# Patient Record
Sex: Male | Born: 1965 | Race: Asian | Hispanic: No | Marital: Married | Smoking: Never smoker
Health system: Southern US, Community
[De-identification: ages and names within clinical notes are randomized; demographics above are authoritative.]

---

## 2011-03-24 ENCOUNTER — Emergency Department (HOSPITAL_COMMUNITY)
Admission: EM | Admit: 2011-03-24 | Discharge: 2011-03-25 | Discharge status: Against Medical Advice | Payer: Worker's Compensation | Attending: Emergency Medicine | Admitting: Emergency Medicine

## 2011-03-24 DIAGNOSIS — Y93G3 Activity, cooking and baking: Secondary | ICD-10-CM | POA: Insufficient documentation

## 2011-03-24 DIAGNOSIS — T23039A Burn of unspecified degree of unspecified multiple fingers (nail), not including thumb, initial encounter: Secondary | ICD-10-CM | POA: Insufficient documentation

## 2011-03-24 DIAGNOSIS — X12XXXA Contact with other hot fluids, initial encounter: Secondary | ICD-10-CM | POA: Insufficient documentation

## 2011-03-24 DIAGNOSIS — X131XXA Other contact with steam and other hot vapors, initial encounter: Secondary | ICD-10-CM | POA: Insufficient documentation

## 2015-09-23 ENCOUNTER — Emergency Department
Admission: EM | Admit: 2015-09-23 | Discharge: 2015-09-23 | Disposition: A | Discharge status: Home / Self Care | Payer: BLUE CROSS/BLUE SHIELD | Source: Home / Self Care | Attending: Family Medicine | Admitting: Family Medicine

## 2015-09-23 ENCOUNTER — Emergency Department (INDEPENDENT_AMBULATORY_CARE_PROVIDER_SITE_OTHER): Payer: BLUE CROSS/BLUE SHIELD

## 2015-09-23 ENCOUNTER — Encounter: Payer: Self-pay | Admitting: *Deleted

## 2015-09-23 DIAGNOSIS — S29012A Strain of muscle and tendon of back wall of thorax, initial encounter: Secondary | ICD-10-CM | POA: Diagnosis not present

## 2015-09-23 DIAGNOSIS — M25511 Pain in right shoulder: Secondary | ICD-10-CM | POA: Diagnosis not present

## 2015-09-23 DIAGNOSIS — M25521 Pain in right elbow: Secondary | ICD-10-CM

## 2015-09-23 MED ORDER — MELOXICAM 15 MG PO TABS: 15.0000 mg | ORAL_TABLET | Freq: Every day | ORAL | Status: DC

## 2015-09-23 MED ORDER — CYCLOBENZAPRINE HCL 10 MG PO TABS: ORAL_TABLET | ORAL | Status: DC

## 2015-09-23 NOTE — Discharge Instructions (Signed)
Apply ice pack for 20 to 30 minutes, 3 to 4 times daily  Continue until pain decreases.  Begin exercises as tolerated.

## 2015-09-23 NOTE — ED Notes (Signed)
Pt c/o RT elbow pain radiating to his RT shoulder x 2-3 wks. He has applied ice, completed Prednisone 09/21/15, and is taking IBF as needed. He reports W/C denied his claim. He can not get an appt with ortho appt til 10/02/15.

## 2015-09-23 NOTE — ED Provider Notes (Signed)
CSN: [id number]     Arrival date & time 09/23/15  1829 History   First MD Initiated Contact with Patient 09/23/15 1935     Chief Complaint  Patient presents with  . Shoulder Pain      HPI Comments: Patient complains of intermittent pain in his right elbow and right shoulder blade area for about 3 weeks.  He recalls no injury, although his job involves repetitive motion with both arms.  He complains of pain in his right elbow area that radiates up towards his right shoulder, and the pain has increased during the past week.  He states that he finished taking a prednisone pack for right lateral epicondylitis about a week ago  The history is provided by the patient.    History reviewed. No pertinent past medical history. History reviewed. No pertinent past surgical history. History reviewed. No pertinent family history. Social History  Substance Use Topics  . Smoking status: Never Smoker   . Smokeless tobacco: None  . Alcohol Use: No    Review of Systems  Constitutional: Negative for fever, chills, activity change and fatigue.  HENT: Negative.   Eyes: Negative.   Respiratory: Negative.   Cardiovascular: Negative.   Gastrointestinal: Negative.   Genitourinary: Negative.   Musculoskeletal: Positive for back pain. Negative for joint swelling.       Pain in right elbow and right upper back  Skin: Negative.   Neurological: Negative.     Allergies  Review of patient's allergies indicates no known allergies.  Home Medications   Prior to Admission medications   Medication Sig Start Date End Date Taking? Authorizing Provider  ibuprofen (ADVIL,MOTRIN) 200 MG tablet Take 200 mg by mouth every 6 (six) hours as needed.   Yes Historical Provider, MD  cyclobenzaprine (FLEXERIL) 10 MG tablet Take one tab by mouth at bedtime for muscle spasm 09/23/15   Lattie HawStephen A Beese, MD  meloxicam (MOBIC) 15 MG tablet Take 1 tablet (15 mg total) by mouth daily. Take with food each morning 09/23/15   Lattie HawStephen A  Beese, MD   Meds Ordered and Administered this Visit  Medications - No data to display  BP 144/88 mmHg  Pulse 73  Temp(Src) 97.6 F (36.4 C) (Oral)  Resp 16  Ht 5\' 6"  (1.676 m)  Wt 187 lb (84.823 kg)  BMI 30.20 kg/m2  SpO2 99% No data found.   Physical Exam  Constitutional: He is oriented to person, place, and time. He appears well-developed and well-nourished. No distress.  HENT:  Head: Normocephalic.  Mouth/Throat: Oropharynx is clear and moist.  Eyes: Pupils are equal, round, and reactive to light.  Neck: Normal range of motion.  Cardiovascular: Normal heart sounds.   Pulmonary/Chest: Breath sounds normal.    There is tenderness to palpation over the right rhomboid area  as noted on diagram.    Musculoskeletal:       Right elbow: He exhibits normal range of motion and no swelling. Tenderness found.  Right shoulder has full range of motion without tenderness to palpation.  Negative Apley's test.  Negative Empty can test. Right  Elbow has full range of motion.  There is vague tenderness to palpation over the radial head, but no tenderness over the epicondyles or olecranon.  Neurological: He is alert and oriented to person, place, and time.  Skin: Skin is warm and dry. No rash noted.  Nursing note and vitals reviewed.   ED Course  Procedures  None   Imaging Review Dg Shoulder Right  09/23/2015  CLINICAL DATA:  Right shoulder pain for 3 weeks potentially due to repetitive work motion. EXAM: RIGHT SHOULDER - 2+ VIEW COMPARISON:  None. FINDINGS: There is no evidence of fracture or dislocation. There is no evidence of arthropathy or other focal bone abnormality. Soft tissues are unremarkable. IMPRESSION: Normal right shoulder. Electronically Signed   By: Lupita Raider, M.D.   On: 09/23/2015 19:14      MDM   1. Rhomboid muscle strain, initial encounter   2. Right elbow pain     Begin Mobic  daily.  Flexeril  HS Apply ice pack for 20 to 30 minutes, 3 to 4  times daily  Continue until pain decreases.  Begin exercises as tolerated. Followup with Dr. Rodney Langton or Dr. Clementeen Graham ([facility]) for further evaluation.    Lattie Haw, MD 09/30/15 [phone]

## 2015-09-25 ENCOUNTER — Ambulatory Visit (INDEPENDENT_AMBULATORY_CARE_PROVIDER_SITE_OTHER): Payer: BLUE CROSS/BLUE SHIELD | Admitting: Family Medicine

## 2015-09-25 ENCOUNTER — Encounter: Payer: Self-pay | Admitting: Family Medicine

## 2015-09-25 VITALS — BP 139/85 | HR 77 | Wt 180.0 lb

## 2015-09-25 DIAGNOSIS — S46311A Strain of muscle, fascia and tendon of triceps, right arm, initial encounter: Secondary | ICD-10-CM

## 2015-09-25 DIAGNOSIS — S46811A Strain of other muscles, fascia and tendons at shoulder and upper arm level, right arm, initial encounter: Secondary | ICD-10-CM | POA: Insufficient documentation

## 2015-09-25 MED ORDER — NAPROXEN 500 MG PO TABS: 500.0000 mg | ORAL_TABLET | Freq: Two times a day (BID) | ORAL | Status: DC

## 2015-09-25 NOTE — Patient Instructions (Signed)
Thank you for coming in today. Attend physical therapy.  Use naproxen for pain as needed.  Return in 2-3 weeks.   TENS UNIT: This is helpful for muscle pain and spasm.   Search and Purchase a TENS 7000 2nd edition at www.tenspros.com. It should be less than $30.     TENS unit instructions: Do not shower or bathe with the unit on Turn the unit off before removing electrodes or batteries If the electrodes lose stickiness add a drop of water to the electrodes after they are disconnected from the unit and place on plastic sheet. If you continued to have difficulty, call the TENS unit company to purchase more electrodes. Do not apply lotion on the skin area prior to use. Make sure the skin is clean and dry as this will help prolong the life of the electrodes. After use, always check skin for unusual red areas, rash or other skin difficulties. If there are any skin problems, does not apply electrodes to the same area. Never remove the electrodes from the unit by pulling the wires. Do not use the TENS unit or electrodes other than as directed. Do not change electrode placement without consultating your therapist or physician. Keep 2 fingers with between each electrode. Wear time ratio is 2:1, on to off times.    For example on for 30 minutes off for 15 minutes and then on for 30 minutes off for 15 minutes

## 2015-09-26 NOTE — Assessment & Plan Note (Signed)
Plan for physical therapy naproxen and Flexeril. Recheck in a few weeks. Light duty for a few weeks.  

## 2015-09-26 NOTE — Progress Notes (Signed)
   Subjective:    I'm seeing this patient as a consultation for:  Dr Cathren HarshBeese  CC: Right shoulder and elbow pain.   HPI: Patientwas injured at work several weeks ago. He developed pain in his right shoulder and right elbow. Pain worsened about a week ago and he was seen in urgent care on March 14. He was prescribed meloxicam and Flexeril which have helped a little. His pain continues. He locates the pain in the mid trapezius and into the right distal triceps insertion. Pain is worse with activity and better with rest. He denies any new injury. He is doing light duty at work with no lifting more than 10 pounds.     Past medical history, Surgical history, Family history not pertinant except as noted below, Social history, Allergies, and medications have been entered into the medical record, reviewed, and no changes needed.   Review of Systems: No headache, visual changes, nausea, vomiting, diarrhea, constipation, dizziness, abdominal pain, skin rash, fevers, chills, night sweats, weight loss, swollen lymph nodes, body aches, joint swelling, muscle aches, chest pain, shortness of breath, mood changes, visual or auditory hallucinations.   Objective:    Filed Vitals:   09/25/15 1522  BP: 139/85  Pulse: 77   General: Well Developed, well nourished, and in no acute distress.  Neuro/Psych: Alert and oriented x3, extra-ocular muscles intact, able to move all 4 extremities, sensation grossly intact. Skin: Warm and dry, no rashes noted.  Respiratory: Not using accessory muscles, speaking in full sentences, trachea midline.  Cardiovascular: Pulses palpable, no extremity edema. Abdomen: Does not appear distended. MSK: Right shoulder is normal appearing. TTP right trap.  Normal shoulder shrug.  C-spine is nontender to midline. Normal neck motion negative Spurling's test Shoulder nontender normal motion negative Hawkins and Neer's O'Brien and empty can AshlandYergason's and speeds test. Elbow tender to  palpation insertion of the distal triceps tendon. Nontender otherwise. Normal elbow motion and strength.  Wrists nontender pulses capillary refill and sensation intact.  No results found for this or any previous visit (from the past 24 hour(s)). No results found.  Impression and Recommendations:   This case required medical decision making of moderate complexity.

## 2015-09-26 NOTE — Assessment & Plan Note (Signed)
Plan for physical therapy naproxen and Flexeril. Recheck in a few weeks. Light duty for a few weeks.

## 2015-10-02 ENCOUNTER — Ambulatory Visit (INDEPENDENT_AMBULATORY_CARE_PROVIDER_SITE_OTHER): Payer: BLUE CROSS/BLUE SHIELD | Admitting: Rehabilitative and Restorative Service Providers"

## 2015-10-02 ENCOUNTER — Encounter: Payer: Self-pay | Admitting: Rehabilitative and Restorative Service Providers"

## 2015-10-02 DIAGNOSIS — R531 Weakness: Secondary | ICD-10-CM

## 2015-10-02 DIAGNOSIS — M797 Fibromyalgia: Secondary | ICD-10-CM

## 2015-10-02 DIAGNOSIS — Z7409 Other reduced mobility: Secondary | ICD-10-CM

## 2015-10-02 DIAGNOSIS — M25521 Pain in right elbow: Secondary | ICD-10-CM

## 2015-10-02 DIAGNOSIS — R6889 Other general symptoms and signs: Secondary | ICD-10-CM

## 2015-10-02 DIAGNOSIS — M79621 Pain in right upper arm: Secondary | ICD-10-CM | POA: Diagnosis not present

## 2015-10-02 DIAGNOSIS — R293 Abnormal posture: Secondary | ICD-10-CM | POA: Diagnosis not present

## 2015-10-02 DIAGNOSIS — M7918 Myalgia, other site: Secondary | ICD-10-CM

## 2015-10-02 NOTE — Therapy (Signed)
[facility] [place] [address] [place], [place], [address] Phone: [phone]   Fax:  [phone]  Physical Therapy Evaluation  Patient Details  Name: Jose Cortez MRN: [id number] Date of Birth: 07-21-1965 Referring Provider: Dr. Clementeen Graham  Encounter Date: 10/02/2015      PT End of Session - 10/02/15 1528    Visit Number 1   Number of Visits 12   Date for PT Re-Evaluation 11/13/15   PT Start Time 1529   PT Stop Time 1627   PT Time Calculation (min) 58 min   Activity Tolerance Patient tolerated treatment well      History reviewed. No pertinent past medical history.  History reviewed. No pertinent past surgical history.  There were no vitals filed for this visit.  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 He notices pain in Rt arm in the elbow followed by pain into the upper back/shoulder blade area. Pain has been present since the end of February. He has difficulty lying down and sleeping at night; resting arm on steering wheel when driving.    Pertinent History Lt wrist strain treated wtih therapy with full resloution of symptoms; plantar fasciitis bilat Rt > Lt    How long can you sit comfortably? sometimes has pain when he is sitting with any pressure on the Rt shoulder area    Diagnostic tests xrays    Patient Stated Goals get rid of the arm and upper back pain; help to learn exercises for home    Currently in Pain? Yes   Pain Score 7    Pain Location Elbow   Pain Orientation Right   Pain Descriptors / Indicators Nagging   Pain Type Acute pain   Pain Radiating Towards up into the shoulder and upper/mid back area    Pain Onset More than a month ago   Pain Frequency Constant   Aggravating Factors  using Rt UE for functional activities   Pain Relieving Factors meds; rest; pain patch             [facility] PT Assessment - 10/02/15 0001    Assessment   Medical Diagnosis Rt trapezius strain    Referring Provider Dr. Clementeen Graham   Onset Date/Surgical Date 09/09/15   Hand Dominance Right   Next MD Visit 4/17   Prior Therapy none   Precautions   Precautions None   Balance Screen   Has the patient fallen in the past 6 months No   Has the patient had a decrease in activity level because of a fear of falling?  No   Is the patient reluctant to leave their home because of a fear of falling?  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 and with materials - 8 hr/day  5 days/wk concrete floors with pad    Observation/Other Assessments   Focus on Therapeutic Outcomes (FOTO)  51% limitation    Sensation   Additional Comments occasionally maybe once every 2 months related to work shakes hands to relieve symptoms    Posture/Postural Control   Posture Comments head forward shoulders rounded and elevated; head of the humerus anterior in orientation; scapulae abducted and rotated along the thoracic wall    AROM   Overall AROM Comments ER ROM - WNL's with some increased ER at ~ 100 degrees assessed in standing with shd 90 deg abd/elbow 90 deg flexion; noted tightness in Rt wrist extension    Cervical Flexion 61   Cervical Extension 48  pain Rt posterior shd area    Cervical - Right Side Bend 45   Cervical - Left Side Bend 38  pulling and  pain Rt posterior shd area    Cervical - Right Rotation 68   Cervical - Left Rotation 65  pain Rt shoulder area   Strength   Overall Strength Comments WFL's bilat UE's except Rt wrist ext 4/5    Palpation   Palpation comment muscular tightness noted through the pecs; middle trap; leveator; biceps; forearm extensors Rt                    [facility] Adult PT Treatment/Exercise - 10/02/15 0001    Therapeutic Activites    Therapeutic Activities --  myofacial ball release work    Neuro Re-ed    Neuro Re-ed Details  working on Passenger transport managerposture and alignment    Exercises   Exercises Shoulder   Shoulder Exercises: Standing   Other Standing Exercises scap squeeze with noodle 10 sec x 10    Shoulder Exercises: Stretch   Other Shoulder Stretches 3 position doorway x 30 sec    Other Shoulder Stretches Rt wrist flex stretch x 30 sec x 2 - cues to keep Rt shoulder down.    Modalities   Modalities Electrical Stimulation;Moist Heat   Moist Heat Therapy   Number Minutes Moist Heat 15 Minutes   Moist Heat Location Shoulder;Elbow   Electrical Stimulation   Electrical Stimulation Location Rt lateral elbow/ Rt upper trap    Electrical Stimulation Action premod to each area    Electrical Stimulation Parameters to tolerance    Electrical Stimulation Goals Pain;Tone                PT Education - 10/02/15 1616    Education provided Yes   Education Details HEP   Person(s) Educated Patient   Methods Explanation;Handout   Comprehension Returned demonstration;Verbalized understanding             PT Long Term Goals - 10/02/15 1611    PT LONG TERM GOAL #1   Title Improve posture and alignment to improve postion and movement patterns of scapulae along the thoracic wall 11/13/15   Time 6   Period Weeks   Status New   PT LONG TERM GOAL #2   Title Patient reports decreased pain in arm and shoulder area allowing him to work with pain no more than 2/10 on 0-10 scale - 11/13/15   Time 6    Period Weeks   Status New   PT LONG TERM GOAL #3   Title Improve strength Rt wrist to 5/5 by 11/13/15   Time 6   Period Weeks   Status New   PT LONG TERM GOAL #4   Title painfree cervical ROM WFL's  11/13/15   Time 6   Period Weeks   Status New   PT LONG TERM GOAL #5   Title I in HEP 11/13/15   Time 6   Period Weeks   Status New   PT LONG TERM GOAL #6   Title Improve FOTO to </= 28% limitation 11/13/15   Time 6   Period Weeks   Status New               Plan - 10/02/15 1605    Clinical Impression Statement Patient presents with Rt upper quarter dysfunction including poor posture and alignment through the scapulae; pain with palpation through pecs, upper and middle traps, leveator, biceps, extensor forearm, lateral epicondyle Rt; limited functional activity level and decreased enduracne for work and ADL's; pain limiting sleep and rest. Patient will benefit from PT to address limitations identified and restore normal function.    Pt will benefit from skilled therapeutic intervention in order to improve on the following deficits Postural dysfunction;Improper body mechanics;Increased fascial restricitons;Increased muscle spasms;Pain;Decreased strength;Decreased range of motion;Decreased mobility;Decreased endurance;Decreased activity tolerance   Rehab Potential Good   PT Frequency 2x / week   PT Duration 6 weeks   PT Treatment/Interventions Patient/family education;ADLs/Self Care Home Management;Therapeutic exercise;Therapeutic activities;Neuromuscular re-education;Manual techniques;Dry needling;Cryotherapy;Electrical Stimulation;Iontophoresis /ml Dexamethasone;Moist Heat;Ultrasound   PT Next Visit Plan postural correction; manual therapy to address muscular tightness; stretching anteriorly; neuromuscular re-ed for proper scapular movement; posterior shoulder girdle strengthening; TDN as indicated    PT Home Exercise Plan myofacial ball release work; HEP    Consulted and Agree with Plan  of Care Patient         Problem List Patient Active Problem List   Diagnosis Date Noted  . Strain of right trapezius muscle 09/25/2015  . Strain of right triceps 09/25/2015    Trevian Hayashida Rober Minion PT, MPH  10/02/2015, 4:20 PM  [facility] [address] [place], [place], [address] Phone: [phone]   Fax:  [phone]  Name: Tavarus Poteete MRN: [id number] Date of Birth: March 21, 1966

## 2015-10-02 NOTE — Patient Instructions (Signed)
Self   Shoulder Blade Squeeze    Rotate shoulders back, then squeeze shoulder blades down and back. Hold for 5-10 sec.  Repeat __10__ times. Do __2__ sessions per day.  http://gt2.exer.us/846   Wrist Extensor Stretch    Keeping elbow straight, grasp left hand and slowly bend wrist forward until stretch is felt. Hold __30__ seconds. Relax. Repeat __2__ times per set. Do __1-2__ sets per session. Do _2___ sessions per day.  Scapula Adduction With Pectorals, Low   Stand in doorframe with palms against frame and arms at 45. Lean forward and squeeze shoulder blades. Hold _30__ seconds. Repeat _2__ times per session. Do _2__ sessions per day.  Copyright  VHI. All rights reserved.    Scapula Adduction With Pectorals, Mid-Range   Stand in doorframe with palms against frame and arms at 90. Lean forward and squeeze shoulder blades. Hold __30_ seconds. Repeat _2__ times per session. Do _2__ sessions per day.  \Scapula Adduction With Pectorals, High   Stand in doorframe with palms against frame and arms at 120. Lean forward and squeeze shoulder blades. Hold __30_ seconds. Repeat _2__ times per session. Do __2_ sessions per day.   [facility] [address] [place], [place] [address]  [phone] (office) [phone] (fax)

## 2015-10-09 ENCOUNTER — Encounter: Payer: Self-pay | Admitting: Rehabilitative and Restorative Service Providers"

## 2015-10-09 ENCOUNTER — Ambulatory Visit (INDEPENDENT_AMBULATORY_CARE_PROVIDER_SITE_OTHER): Payer: BLUE CROSS/BLUE SHIELD | Admitting: Rehabilitative and Restorative Service Providers"

## 2015-10-09 DIAGNOSIS — R293 Abnormal posture: Secondary | ICD-10-CM | POA: Diagnosis not present

## 2015-10-09 DIAGNOSIS — M79621 Pain in right upper arm: Secondary | ICD-10-CM

## 2015-10-09 DIAGNOSIS — R531 Weakness: Secondary | ICD-10-CM

## 2015-10-09 DIAGNOSIS — Z7409 Other reduced mobility: Secondary | ICD-10-CM

## 2015-10-09 DIAGNOSIS — M797 Fibromyalgia: Secondary | ICD-10-CM

## 2015-10-09 DIAGNOSIS — M7918 Myalgia, other site: Secondary | ICD-10-CM

## 2015-10-09 DIAGNOSIS — M25521 Pain in right elbow: Secondary | ICD-10-CM

## 2015-10-09 NOTE — Therapy (Signed)
[facility] [address] [place], [place], [address] Phone: [phone]   Fax:  [phone]  Physical Therapy Treatment  Patient Details  Name: Jose Cortez MRN: [id number] Date of Birth: Oct 01, 1965 Referring Provider: Dr. Clementeen Graham  Encounter Date: 10/09/2015      PT End of Session - 10/09/15 1539    Visit Number 2   Number of Visits 12   Date for PT Re-Evaluation 11/13/15   PT Start Time 1535   PT Stop Time 1627   PT Time Calculation (min) 52 min   Activity Tolerance Patient tolerated treatment well      History reviewed. No pertinent past medical history.  History reviewed. No pertinent past surgical history.  There were no vitals filed for this visit.  Visit Diagnosis:  Pain in joint, upper arm, right  Myofacial muscle pain  Abnormal posture  Decreased strength, endurance, and mobility      Subjective Assessment - 10/09/15 1547    Subjective Doing exercises at home and stretching. Can tell he is some better - but he still has pain. He is using the ball at home as well.    Currently in Pain? Yes   Pain Score 8    Pain Location Shoulder   Pain Orientation Right   Pain Descriptors / Indicators Nagging   Pain Type Acute pain   Pain Onset More than a month ago   Pain Frequency Constant                         [facility] Adult PT Treatment/Exercise - 10/09/15 0001    Self-Care   Self-Care --  education re body mechanics - turning body not twisting    Therapeutic Activites    Therapeutic Activities --  myofacial ball release work    Neuro Re-ed    Neuro Re-ed Details  working on posture and alignment    Shoulder Exercises: Supine   Other Supine Exercises prolonged snow angle stretch 2-3 min arms at ~ 85 deg abduction    Shoulder Exercises: Standing   Other Standing Exercises scap squeeze with noodle 10 sec x 10    Shoulder Exercises: ROM/Strengthening   UBE (Upper Arm Bike) L2 4 min  alt fwd/back    Shoulder Exercises: Stretch   Other Shoulder Stretches 3 position doorway x 30 sec    Other Shoulder Stretches Rt wrist flex stretch x 30 sec x 2 - cues to keep Rt shoulder down.    Modalities   Modalities Electrical Stimulation;Moist Heat   Moist Heat Therapy   Number Minutes Moist Heat 15 Minutes   Moist Heat Location Shoulder;Elbow   Electrical Stimulation   Electrical Stimulation Location Rt shouder    Electrical Stimulation Action IFC   Electrical Stimulation Parameters to tolerance   Electrical Stimulation Goals Pain;Tone   Iontophoresis   Type of Iontophoresis Dexamethasone   Location Rt forearm    Dose 80 mAmp   Time 6 hr wear time    Vasopneumatic   Number Minutes Vasopneumatic  15 minutes   Vasopnuematic Location  Shoulder  Rt   Vasopneumatic Pressure Medium   Vasopneumatic Temperature  3*   Manual Therapy   Manual therapy comments pt supine    Soft tissue mobilization working through pecs; upper trap; leveator; deltoid; biceps; extensor forewarm    Myofascial Release pecs;ant shoulder; forearm    Scapular Mobilization Rt  PT Long Term Goals - 10/09/15 1546    PT LONG TERM GOAL #1   Title Improve posture and alignment to improve postion and movement patterns of scapulae along the thoracic wall 11/13/15   Time 6   Period Weeks   Status On-going   PT LONG TERM GOAL #2   Title Patient reports decreased pain in arm and shoulder area allowing him to work with pain no more than 2/10 on 0-10 scale - 11/13/15   Time 6   Period Weeks   Status On-going   PT LONG TERM GOAL #3   Title Improve strength Rt wrist to 5/5 by 11/13/15   Time 6   Period Weeks   Status On-going   PT LONG TERM GOAL #4   Title painfree cervical ROM WFL's 11/13/15   Time 6   Period Weeks   Status On-going   PT LONG TERM GOAL #5   Title I in HEP 11/13/15   Time 6   Period Weeks   Status On-going   PT LONG TERM GOAL #6   Title Improve FOTO to </= 28%  limitation 11/13/15   Time 6   Period Weeks   Status On-going               Plan - 10/09/15 1539    Clinical Impression Statement Patient reports some improvement in symptoms with HEP but he is still having some pain. He is doing his stretches at home. Patient demonstrates good improvement with posture and mobility. He is progressing well well with rehab - toward stated goals of therapy.    Pt will benefit from skilled therapeutic intervention in order to improve on the following deficits Postural dysfunction;Improper body mechanics;Increased fascial restricitons;Increased muscle spasms;Pain;Decreased strength;Decreased range of motion;Decreased mobility;Decreased endurance;Decreased activity tolerance   Rehab Potential Good   PT Frequency 2x / week   PT Duration 6 weeks   PT Treatment/Interventions Patient/family education;ADLs/Self Care Home Management;Therapeutic exercise;Therapeutic activities;Neuromuscular re-education;Manual techniques;Dry needling;Cryotherapy;Electrical Stimulation;Iontophoresis 4mg /ml Dexamethasone;Moist Heat;Ultrasound   PT Next Visit Plan postural correction; manual therapy to address muscular tightness; stretching anteriorly; neuromuscular re-ed for proper scapular movement; posterior shoulder girdle strengthening; TDN as indicated    PT Home Exercise Plan myofacial ball release work; HEP    Consulted and Agree with Plan of Care Patient        Problem List Patient Active Problem List   Diagnosis Date Noted  . Strain of right trapezius muscle 09/25/2015  . Strain of right triceps 09/25/2015    Jose Cortez Jose Cortez PT, MPH  10/09/2015, 4:32 PM  [facility] [address] [place], [place], [address] Phone: [phone]   Fax:  [phone]  Name: Jose Cortez MRN: [id number] Date of Birth: 07/03/1966

## 2015-10-15 ENCOUNTER — Ambulatory Visit (INDEPENDENT_AMBULATORY_CARE_PROVIDER_SITE_OTHER): Payer: BLUE CROSS/BLUE SHIELD | Admitting: Rehabilitative and Restorative Service Providers"

## 2015-10-15 ENCOUNTER — Encounter: Payer: Self-pay | Admitting: Rehabilitative and Restorative Service Providers"

## 2015-10-15 DIAGNOSIS — Z7409 Other reduced mobility: Secondary | ICD-10-CM | POA: Diagnosis not present

## 2015-10-15 DIAGNOSIS — M797 Fibromyalgia: Secondary | ICD-10-CM | POA: Diagnosis not present

## 2015-10-15 DIAGNOSIS — R6889 Other general symptoms and signs: Secondary | ICD-10-CM

## 2015-10-15 DIAGNOSIS — R293 Abnormal posture: Secondary | ICD-10-CM

## 2015-10-15 DIAGNOSIS — R531 Weakness: Secondary | ICD-10-CM

## 2015-10-15 DIAGNOSIS — M25521 Pain in right elbow: Secondary | ICD-10-CM

## 2015-10-15 DIAGNOSIS — M79621 Pain in right upper arm: Secondary | ICD-10-CM

## 2015-10-15 DIAGNOSIS — M7918 Myalgia, other site: Secondary | ICD-10-CM

## 2015-10-15 NOTE — Therapy (Addendum)
[facility] [place] [address] [place], [place], [address] Phone: [phone]   Fax:  [phone]  Physical Therapy Treatment  Patient Details  Name: Jose Cortez MRN: [id number] Date of Birth: 06/05/1966 Referring Provider: Dr. Clementeen Graham  Encounter Date: 10/15/2015      PT End of Session - 10/15/15 1605    Visit Number 3   Number of Visits 12   Date for PT Re-Evaluation 11/13/15   PT Start Time 1602   PT Stop Time 1700   PT Time Calculation (min) 58 min   Activity Tolerance Patient tolerated treatment well      History reviewed. No pertinent past medical history.  History reviewed. No pertinent past surgical history.  There were no vitals filed for this visit.  Visit Diagnosis:  Pain in joint, upper arm, right  Myofacial muscle pain  Abnormal posture  Decreased strength, endurance, and mobility      Subjective Assessment - 10/15/15 1606    Subjective Shoulder is improving he tried to return to task at work that irritated the shoulder and lasted ~ 30 min when he experienced increased pain.    Currently in Pain? Yes   Pain Score 5    Pain Orientation Right   Pain Descriptors / Indicators Nagging   Pain Type Acute pain                         [facility] Adult PT Treatment/Exercise - 10/15/15 0001    Neuro Re-ed    Neuro Re-ed Details  working on posture and alignment    Shoulder Exercises: Supine   Other Supine Exercises prolonged snow angle stretch 2-3 min arms at ~ 85 deg abduction    Shoulder Exercises: Seated   Other Seated Exercises wrist flex x 10; ext x 10 2# wt    Shoulder Exercises: Standing   Extension Strengthening;Both;10 reps;Theraband   Theraband Level (Shoulder Extension) Level 2 (Red)   Row Strengthening;Both;10 reps;Theraband   Theraband Level (Shoulder Row) Level 2 (Red)   Retraction Strengthening;Both;10 reps;Theraband   Theraband Level (Shoulder Retraction) Level 2 (Red)    Other Standing Exercises scap squeeze with noodle 10 sec x 10    Shoulder Exercises: ROM/Strengthening   UBE (Upper Arm Bike) L3 4 min alt fwd/back    Shoulder Exercises: Stretch   Other Shoulder Stretches 3 position doorway x 30 sec    Other Shoulder Stretches Rt wrist flex stretch x 30 sec x 2 - cues to keep Rt shoulder down.    Modalities   Modalities Electrical Stimulation;Moist Heat   Moist Heat Therapy   Number Minutes Moist Heat 15 Minutes   Moist Heat Location Shoulder;Elbow   Electrical Stimulation   Electrical Stimulation Location Rt shouder    Electrical Stimulation Action IFC   Electrical Stimulation Parameters to tolerance   Electrical Stimulation Goals Pain;Tone   Iontophoresis   Type of Iontophoresis Dexamethasone   Location Rt forearm    Dose 80 mAmp   Time 6 hr wear time    Manual Therapy   Manual therapy comments pt supine    Soft tissue mobilization working through pecs; upper trap; leveator; deltoid; biceps; extensor forewarm    Myofascial Release pecs;ant shoulder; forearm    Scapular Mobilization Rt                PT Education - 10/15/15 1648    Education provided Yes   Education Details HEP  Person(s) Educated Patient   Methods Explanation;Demonstration;Tactile cues;Verbal cues;Handout   Comprehension Verbalized understanding;Returned demonstration;Verbal cues required;Tactile cues required             PT Long Term Goals - 10/15/15 1653    PT LONG TERM GOAL #1   Title Improve posture and alignment to improve postion and movement patterns of scapulae along the thoracic wall 11/13/15   Time 6   Period Weeks   Status On-going   PT LONG TERM GOAL #2   Title Patient reports decreased pain in arm and shoulder area allowing him to work with pain no more than 2/10 on 0-10 scale - 11/13/15   Time 6   Period Weeks   Status On-going   PT LONG TERM GOAL #3   Title Improve strength Rt wrist to 5/5 by 11/13/15   Time 6   Period Weeks   PT LONG  TERM GOAL #4   Title painfree cervical ROM WFL's 11/13/15   Time 6   Period Weeks   Status On-going   PT LONG TERM GOAL #5   Title I in HEP 11/13/15   Time 6   Period Weeks   Status On-going   PT LONG TERM GOAL #6   Title Improve FOTO to </= 28% limitation 11/13/15   Time 6   Period Weeks   Status On-going               Plan - 10/15/15 1652    Clinical Impression Statement Continued improvement in symptoms in shoulder and forearm. Progressing well toward stated goals of treatment.    Pt will benefit from skilled therapeutic intervention in order to improve on the following deficits Postural dysfunction;Improper body mechanics;Increased fascial restricitons;Increased muscle spasms;Pain;Decreased strength;Decreased range of motion;Decreased mobility;Decreased endurance;Decreased activity tolerance   Rehab Potential Good   PT Frequency 2x / week   PT Duration 6 weeks   PT Treatment/Interventions Patient/family education;ADLs/Self Care Home Management;Therapeutic exercise;Therapeutic activities;Neuromuscular re-education;Manual techniques;Dry needling;Cryotherapy;Electrical Stimulation;Iontophoresis 4mg /ml Dexamethasone;Moist Heat;Ultrasound   PT Next Visit Plan postural correction; manual therapy to address muscular tightness; stretching anteriorly; neuromuscular re-ed for proper scapular movement; posterior shoulder girdle strengthening; TDN as indicated    PT Home Exercise Plan myofacial ball release work; HEP    Consulted and Agree with Plan of Care Patient        Problem List Patient Active Problem List   Diagnosis Date Noted  . Strain of right trapezius muscle 09/25/2015  . Strain of right triceps 09/25/2015    Amillion Macchia Rober MinionP Layani Foronda PT, MPH  10/15/2015, 4:55 PM  [facility] [address] [place], [place], [address] Phone: [phone]   Fax:  [phone]  Name: Jose Cortez MRN: [id number] Date of Birth:  07/03/1966

## 2015-10-15 NOTE — Patient Instructions (Signed)

## 2015-10-22 ENCOUNTER — Encounter: Payer: Self-pay | Admitting: Physical Therapy

## 2015-10-30 ENCOUNTER — Ambulatory Visit (INDEPENDENT_AMBULATORY_CARE_PROVIDER_SITE_OTHER): Payer: BLUE CROSS/BLUE SHIELD | Admitting: Physical Therapy

## 2015-10-30 DIAGNOSIS — M797 Fibromyalgia: Secondary | ICD-10-CM | POA: Diagnosis not present

## 2015-10-30 DIAGNOSIS — R531 Weakness: Secondary | ICD-10-CM

## 2015-10-30 DIAGNOSIS — M79621 Pain in right upper arm: Secondary | ICD-10-CM | POA: Diagnosis not present

## 2015-10-30 DIAGNOSIS — R293 Abnormal posture: Secondary | ICD-10-CM

## 2015-10-30 DIAGNOSIS — M7918 Myalgia, other site: Secondary | ICD-10-CM

## 2015-10-30 DIAGNOSIS — Z7409 Other reduced mobility: Secondary | ICD-10-CM

## 2015-10-30 DIAGNOSIS — M25521 Pain in right elbow: Secondary | ICD-10-CM

## 2015-10-30 NOTE — Patient Instructions (Signed)
Over Head Pull: Narrow Grip     [place] [phone]   On back, knees bent, feet flat, band across thighs, elbows straight but relaxed. Pull hands apart (start). Keeping elbows straight, bring arms up and over head, hands toward floor. Keep pull steady on band. Hold momentarily. Return slowly, keeping pull steady, back to start. Repeat _10__ times. Band color __green____   Side Pull: Double Arm   On back, knees bent, feet flat. Arms perpendicular to body, shoulder level, elbows straight but relaxed. Pull arms out to sides, elbows straight. Resistance band comes across collarbones, hands toward floor. Hold momentarily. Slowly return to starting position. Repeat _10__ times. Band color _green____   Sash   On back, knees bent, feet flat, left hand on left hip, right hand above left. Pull right arm DIAGONALLY (hip to shoulder) across chest. Bring right arm along head toward floor. Hold momentarily. Slowly return to starting position. Repeat _10__ times, 1-2 sets. Do with each arm. Band color __green____   Shoulder Rotation: Double Arm   On back, knees bent, feet flat, elbows tucked at sides, bent 90, hands palms up. Pull hands apart and down toward floor, keeping elbows near sides. Hold momentarily. Slowly return to starting position. (This can be done standing with noodle OR laying down as shown above) Repeat _10__ times. Band color ___green___    [facility] [address] [place], [place] [address]  [phone] (office) [phone] (fax)

## 2015-10-30 NOTE — Therapy (Addendum)
[place], [place], [address] Phone: [phone]   Fax:  [phone]  Physical Therapy Treatment  Patient Details  Name: Jose Cortez MRN: [id number] Date of Birth: [birth year] Referring Provider: Dr. Georgina Snell  Encounter Date: 10/30/2015      PT End of Session - 10/30/15 1544    Visit Number 4   Number of Visits 12   Date for PT Jose-Evaluation 11/13/15   PT Start Time 7858   PT Stop Time 1618   PT Time Calculation (min) 43 min   Activity Tolerance Patient tolerated treatment well      No past medical history on file.  No past surgical history on file.  There were no vitals filed for this visit.      Subjective Assessment - 10/30/15 1538    Subjective Pt reports he hasn't pain in over a wk.  Pt is performing HEP 2x/day.   Pt is able to perform chopping in kitchen without difficulty, but still feels weak.  He feels stretches have really helped.    Pertinent History Lt wrist strain treated wtih therapy with full resloution of symptoms; plantar fasciitis bilat Rt > Lt    Currently in Pain? No/denies            [facility] PT Assessment - 10/30/15 0001    Assessment   Medical Diagnosis Rt trapezius strain    Referring Provider Dr. Georgina Snell   Onset Date/Surgical Date 09/09/15   Hand Dominance Right   Next MD Visit PRN   Observation/Other Assessments   Focus on Therapeutic Outcomes (FOTO)  12% Limited.    AROM   Cervical Flexion 60   Cervical Extension 55   Cervical - Right Side Bend 45   Cervical - Left Side Bend 45   Cervical - Right Rotation 75   Cervical - Left Rotation 72          [facility] Adult PT Treatment/Exercise - 10/30/15 0001    Self-Care   Self-Care Other Self-Care Comments   Other Self-Care Comments  Educated pt regarding self massage with noodle, ball and wall corner    Shoulder Exercises: Supine   Horizontal ABduction Both;15 reps   Theraband Level (Shoulder Horizontal  ABduction) Level 3 (Green)   Flexion Strengthening;Both;10 reps;Theraband   Theraband Level (Shoulder Flexion) Level 3 (Green)  overhead pull)   Other Supine Exercises D2 flexion with red band x 8 reps; repeated with green band - each arm     Shoulder Exercises: Seated   Other Seated Exercises wrist flex and ext stretch x 30 sec each wrist.    Shoulder Exercises: Standing   Extension Strengthening;Both;10 reps;Theraband  2 sets   Theraband Level (Shoulder Extension) Level 3 (Green)   Row Strengthening;Both;10 reps;Theraband  2 sets   Theraband Level (Shoulder Row) Level 3 (Green)   Other Standing Exercises scap squeeze with pool noodle and shoulder ER x 10 reps x 2 sets (with green band)   Shoulder Exercises: ROM/Strengthening   UBE (Upper Arm Bike) L3 4 min alt fwd/back    Shoulder Exercises: Stretch   Other Shoulder Stretches 3 position doorway x 60 sec    Other Shoulder Stretches Hooklying on pool noodle  and snow angels x 10 reps    Modalities   Modalities --  pt declined, will use heat/ estim at home if needed           PT Long Term Goals - 10/30/15 1620    PT  LONG TERM GOAL #1   Title Improve posture and alignment to improve postion and movement patterns of scapulae along the thoracic wall 11/13/15   Time 6   Period Weeks   Status Achieved   PT LONG TERM GOAL #2   Title Patient reports decreased pain in arm and shoulder area allowing him to work with pain no more than 2/10 on 0-10 scale - 11/13/15   Time 6   Period Weeks   Status Achieved   PT LONG TERM GOAL #3   Title Improve strength Rt wrist to 5/5 by 11/13/15   Time 6   Period Weeks   Status Achieved   PT LONG TERM GOAL #4   Title painfree cervical ROM WFL's 11/13/15   Time 6   Period Weeks   PT LONG TERM GOAL #5   Title I in HEP 11/13/15   Time 6   Period Weeks   Status Achieved   PT LONG TERM GOAL #6   Title Improve FOTO to </= 28% limitation 11/13/15   Time 6   Period Weeks   Status Achieved                Plan - 10/30/15 1621    Clinical Impression Statement Pt tolerated all new exercises with increased resistance without increase/ production of pain.  Pt has met his goals at this time, feels agreeable to d/c in 2 weeks if no further flare ups.     Rehab Potential Good   PT Frequency 2x / week   PT Duration 6 weeks   PT Treatment/Interventions Patient/family education;ADLs/Self Care Home Management;Therapeutic exercise;Therapeutic activities;Neuromuscular Jose-education;Manual techniques;Dry needling;Cryotherapy;Electrical Stimulation;Iontophoresis 49m/ml Dexamethasone;Moist Heat;Ultrasound   PT Next Visit Plan Hold therapy for 2 wks, if no further flare ups will d/c to HEP.    Consulted and Agree with Plan of Care Patient      Patient will benefit from skilled therapeutic intervention in order to improve the following deficits and impairments:  Postural dysfunction, Improper body mechanics, Increased fascial restricitons, Increased muscle spasms, Pain, Decreased strength, Decreased range of motion, Decreased mobility, Decreased endurance, Decreased activity tolerance  Visit Diagnosis: Pain in joint, upper arm, right  Myofacial muscle pain  Abnormal posture  Decreased strength, endurance, and mobility     Problem List Patient Active Problem List   Diagnosis Date Noted  . Strain of right trapezius muscle 09/25/2015  . Strain of right triceps 09/25/2015   JKerin Perna PTA 10/30/2015 4:35 PM  [place] [address]Phone: [phone]  Fax:  [phone] Name: Jose TinnelMRN: [id number] of Birth: 1Feb 26, 1967   PHYSICAL THERAPY DISCHARGE SUMMARY  Visits from Start of Care: 4  Current functional level related to goals / functional outcomes: No pain; improved symptoms; progressing well    Remaining deficits: unknown   Education / Equipment: HEP    Plan: Patient agrees to discharge.  Patient goals were partially met. Patient is being discharged due to not returning since the last visit.  ?????     Celyn P. HHelene KelpPT, MPH 12/24/2015 1:01 PM

## 2016-07-25 ENCOUNTER — Emergency Department
Admission: EM | Admit: 2016-07-25 | Discharge: 2016-07-25 | Disposition: A | Discharge status: Home / Self Care | Payer: BLUE CROSS/BLUE SHIELD | Source: Home / Self Care | Attending: Family Medicine | Admitting: Family Medicine

## 2016-07-25 DIAGNOSIS — H6691 Otitis media, unspecified, right ear: Secondary | ICD-10-CM

## 2016-07-25 MED ORDER — FLUTICASONE PROPIONATE 50 MCG/ACT NA SUSP: 2.0000 | Freq: Every day | NASAL | 2 refills | Status: AC

## 2016-07-25 MED ORDER — AMOXICILLIN 500 MG PO CAPS: 500.0000 mg | ORAL_CAPSULE | Freq: Three times a day (TID) | ORAL | 0 refills | Status: AC

## 2016-07-25 NOTE — ED Triage Notes (Signed)
Patient c/o  Sinus pain and pressure, sinus drainage and ear pain, states that he has had symptoms for close to a month, states that he has not tried anything over the counter.

## 2016-07-25 NOTE — ED Provider Notes (Cosign Needed)
CSN: [id number]     Arrival date & time 07/25/16  1131 History   First MD Initiated Contact with Patient 07/25/16 1206     Chief Complaint  Patient presents with  . Facial Pain   (Consider location/radiation/quality/duration/timing/severity/associated sxs/prior Treatment) HPI  Damontay Roseboom is a 51 y.o. male presenting to UC with c/o 3-4 weeks of sinus congestion, post-nasal drip and Right ear pain. Symptoms have gradually worsened. No relief with ibuprofen. He notes his daughter and wife have also been sick recently. Denies known fever. No n/v/d. Denies HA at this time but hx of sinus infections.    History reviewed. No pertinent past medical history. History reviewed. No pertinent surgical history. History reviewed. No pertinent family history. Social History  Substance Use Topics  . Smoking status: Never Smoker  . Smokeless tobacco: Never Used  . Alcohol use No    Review of Systems  Constitutional: Negative for chills and fever.  HENT: Positive for congestion, ear pain (Right), rhinorrhea, sinus pressure and sore throat. Negative for sinus pain, trouble swallowing and voice change.   Respiratory: Positive for cough. Negative for shortness of breath.   Cardiovascular: Negative for chest pain and palpitations.  Gastrointestinal: Negative for abdominal pain, diarrhea, nausea and vomiting.  Musculoskeletal: Negative for arthralgias, back pain and myalgias.  Skin: Negative for rash.  Neurological: Negative for dizziness, light-headedness and headaches.    Allergies  Patient has no known allergies.  Home Medications   Prior to Admission medications   Medication Sig Start Date End Date Taking? Authorizing Provider  amoxicillin (AMOXIL) 500 MG capsule Take 1 capsule (500 mg total) by mouth 3 (three) times daily. 07/25/16   Junius Finner, PA-C  fluticasone (FLONASE) 50 MCG/ACT nasal spray Place 2 sprays into both nostrils daily. 07/25/16   Junius Finner, PA-C  ibuprofen (ADVIL,MOTRIN)  200 MG tablet Take 200 mg by mouth every 6 (six) hours as needed.    Historical Provider, MD   Meds Ordered and Administered this Visit  Medications - No data to display  BP 120/84 (BP Location: Left Arm)   Pulse 93   Temp 98.1 F (36.7 C) (Oral)   Wt 189 lb (85.7 kg)   SpO2 97%   BMI 30.51 kg/m  No data found.   Physical Exam  Constitutional: He is oriented to person, place, and time. He appears well-developed and well-nourished. No distress.  HENT:  Head: Normocephalic and atraumatic.  Right Ear: Tympanic membrane is erythematous and bulging.  Left Ear: Tympanic membrane normal.  Nose: Nose normal.  Mouth/Throat: Uvula is midline and mucous membranes are normal. Posterior oropharyngeal erythema present. No oropharyngeal exudate, posterior oropharyngeal edema or tonsillar abscesses.  Eyes: EOM are normal.  Neck: Normal range of motion. Neck supple.  Cardiovascular: Normal rate and regular rhythm.   Pulmonary/Chest: Effort normal and breath sounds normal. No stridor. No respiratory distress. He has no wheezes. He has no rales.  Musculoskeletal: Normal range of motion.  Lymphadenopathy:    He has no cervical adenopathy.  Neurological: He is alert and oriented to person, place, and time.  Skin: Skin is warm and dry. He is not diaphoretic.  Psychiatric: He has a normal mood and affect. His behavior is normal.  Nursing note and vitals reviewed.   Urgent Care Course   Clinical Course     Procedures (including critical care time)  Labs Review Labs Reviewed - No data to display  Imaging Review No results found.   MDM   1. Right acute  otitis media    Pt c/o 3-4 weeks of congestion. Exam c/w Right AOM.  Rx: amoxicillin and Flonase  F/u with PCP in 1 week if not improving.    Junius Finnerrin O'Malley, PA-C 07/25/16 1221

## 2016-07-30 ENCOUNTER — Telehealth: Payer: Self-pay | Admitting: Emergency Medicine

## 2016-07-30 NOTE — Telephone Encounter (Signed)
Patient taking prescribed meds but still having ear ache; suggested he complete antibiotics and also make appt. with pcp that he can cancel if further improvement.

## 2016-10-22 ENCOUNTER — Other Ambulatory Visit: Payer: Self-pay

## 2016-10-22 ENCOUNTER — Emergency Department
Admission: EM | Admit: 2016-10-22 | Discharge: 2016-10-22 | Disposition: A | Discharge status: Home / Self Care | Payer: BLUE CROSS/BLUE SHIELD | Source: Home / Self Care | Attending: Family Medicine | Admitting: Family Medicine

## 2016-10-22 ENCOUNTER — Encounter: Payer: Self-pay | Admitting: *Deleted

## 2016-10-22 ENCOUNTER — Emergency Department (INDEPENDENT_AMBULATORY_CARE_PROVIDER_SITE_OTHER): Payer: BLUE CROSS/BLUE SHIELD

## 2016-10-22 ENCOUNTER — Emergency Department (INDEPENDENT_AMBULATORY_CARE_PROVIDER_SITE_OTHER)
Admission: EM | Admit: 2016-10-22 | Discharge: 2016-10-22 | Disposition: A | Discharge status: Home / Self Care | Payer: BLUE CROSS/BLUE SHIELD | Source: Home / Self Care

## 2016-10-22 DIAGNOSIS — M940 Chondrocostal junction syndrome [Tietze]: Secondary | ICD-10-CM

## 2016-10-22 DIAGNOSIS — R079 Chest pain, unspecified: Secondary | ICD-10-CM

## 2016-10-22 MED ORDER — MELOXICAM 15 MG PO TABS: 15.0000 mg | ORAL_TABLET | Freq: Every day | ORAL | 0 refills | Status: AC

## 2016-10-22 NOTE — ED Triage Notes (Signed)
Patient c/o 2 weeks of left upper chest tightness. 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 He reports the room gets smoky. The pain has persisted but worse when lying down. Denies SOB or any associated symptoms.

## 2016-10-22 NOTE — ED Provider Notes (Signed)
[facility]    CSN: [id number] Arrival date & time: 10/22/16  1816     History   Chief Complaint No chief complaint on file.   HPI Jose Cortez is a 51 y.o. male.   Patient complains of constant pain/tightness in his left anterior/upper chest for two weeks.  The pain does not radiate and is worse when he is supine. No shortness of breath, nausea, sweats.  He reports that he does "plank" exercises every morning and is wondering if that could be the cause.   The history is provided by the patient.  Chest Pain  Pain location:  L chest Pain quality: aching and tightness   Pain radiates to:  Does not radiate Pain severity:  Mild Onset quality:  Gradual Duration:  2 weeks Timing:  Constant Progression:  Unchanged Chronicity:  New Context: lifting and movement   Relieved by:  None tried Worsened by:  Certain positions Ineffective treatments:  None tried Associated symptoms: no abdominal pain, no AICD problem, no anorexia, no back pain, no claudication, no cough, no diaphoresis, no dysphagia, no fatigue, no fever, no headache, no heartburn, no lower extremity edema, no nausea, no palpitations, no PND, no shortness of breath and no syncope   Risk factors: male sex     No past medical history on file.  Patient Active Problem List   Diagnosis Date Noted  . Strain of right trapezius muscle 09/25/2015  . Strain of right triceps 09/25/2015    No past surgical history on file.     Home Medications    Prior to Admission medications   Medication Sig Start Date End Date Taking? Authorizing Provider  amoxicillin (AMOXIL) 500 MG capsule Take 1 capsule (500 mg total) by mouth 3 (three) times daily. 07/25/16   Junius Finner, PA-C  fluticasone (FLONASE) 50 MCG/ACT nasal spray Place 2 sprays into both nostrils daily. 07/25/16   Junius Finner, PA-C  ibuprofen (ADVIL,MOTRIN) 200 MG tablet Take 200 mg by mouth every 6 (six) hours as needed.    Historical Provider, MD    meloxicam (MOBIC) 15 MG tablet Take 1 tablet (15 mg total) by mouth daily. Take with food each morning 10/22/16   Lattie Haw, MD    Family History Family History  Problem Relation Age of Onset  . Goiter Mother   . Cancer Father     prostate    Social History Social History  Substance Use Topics  . Smoking status: Never Smoker  . Smokeless tobacco: Never Used  . Alcohol use No     Allergies   Patient has no known allergies.   Review of Systems Review of Systems  Constitutional: Negative for diaphoresis, fatigue and fever.  HENT: Negative for trouble swallowing.   Respiratory: Negative for cough and shortness of breath.   Cardiovascular: Positive for chest pain. Negative for palpitations, claudication, syncope and PND.  Gastrointestinal: Negative for abdominal pain, anorexia, heartburn and nausea.  Musculoskeletal: Negative for back pain.  Neurological: Negative for headaches.  All other systems reviewed and are negative.    Physical Exam Triage Vital Signs ED Triage Vitals  Enc Vitals Group     BP      Pulse      Resp      Temp      Temp src      SpO2      Weight      Height      Head Circumference      Peak  Flow      Pain Score      Pain Loc      Pain Edu?      Excl. in GC?    No data found.   Updated Vital Signs There were no vitals taken for this visit.  Visual Acuity Right Eye Distance:   Left Eye Distance:   Bilateral Distance:    Right Eye Near:   Left Eye Near:    Bilateral Near:     Physical Exam  Constitutional: He appears well-developed and well-nourished. No distress.  HENT:  Head: Normocephalic.  Right Ear: External ear normal.  Left Ear: External ear normal.  Nose: Nose normal.  Mouth/Throat: Oropharynx is clear and moist.  Eyes: Conjunctivae are normal. Pupils are equal, round, and reactive to light.  Neck: Normal range of motion. Neck supple.  Cardiovascular: Normal rate, regular rhythm and normal heart sounds.    Pulmonary/Chest: Breath sounds normal.    Chest:  Distinct tenderness to palpation over the mid-sternum and left anterior ribs and pectoralis muscle as noted on diagram.   Abdominal: Soft. There is no tenderness.  Musculoskeletal: He exhibits no edema.  Lymphadenopathy:    He has no cervical adenopathy.  Neurological: He is alert.  Skin: Skin is warm and dry. No rash noted. He is not diaphoretic.  Nursing note and vitals reviewed.    UC Treatments / Results  Labs (all labs ordered are listed, but only abnormal results are displayed) Labs Reviewed - No data to display  EKG  EKG Interpretation   Rate:  70 BPM PR:  156 msec QT:  362 msec QTcH:  390 msec QRSD:   86 msec QRS axis:  -19 degrees Interpretation:  within normal limits; normal sinus rhythm        Radiology Dg Chest 2 View  Result Date: 10/22/2016 CLINICAL DATA:  51 y/o  M; chest pain. EXAM: CHEST  2 VIEW COMPARISON:  None. FINDINGS: The heart size and mediastinal contours are within normal limits. Both lungs are clear. The visualized skeletal structures are unremarkable. IMPRESSION: No active cardiopulmonary disease. Electronically Signed   By: Mitzi Hansen M.D.   On: 10/22/2016 18:44    Procedures Procedures (including critical care time)  Medications Ordered in UC Medications - No data to display   Initial Impression / Assessment and Plan / UC Course  I have reviewed the triage vital signs and the nursing notes.  Pertinent labs & imaging results that were available during my care of the patient were reviewed by me and considered in my medical decision making (see chart for details).    Normal EKG and chest X-ray reassuring. Begin Mobic  Put ice on the painful area:  Put ice in a plastic bag.  Place a towel between your skin and the bag.  Leave the ice on for 20 minutes, 2-3 times a day.  Followup with Dr. Rodney Langton or Dr. Clementeen Graham ([facility]) if not  improving about two weeks.       Final Clinical Impressions(s) / UC Diagnoses   Final diagnoses:  Costochondritis    New Prescriptions New Prescriptions   MELOXICAM (MOBIC) 15 MG TABLET    Take 1 tablet (15 mg total) by mouth daily. Take with food each morning     Lattie Haw, MD 10/28/16 1026

## 2016-10-22 NOTE — Discharge Instructions (Signed)
Put ice on the painful area. Put ice in a plastic bag. Place a towel between your skin and the bag. Leave the ice on for 20 minutes, 2-3 times a day. 

## 2018-01-21 IMAGING — DX DG CHEST 2V
2 series · 2 of 2 positions shown · non-contrast
Comparison: None.

CLINICAL DATA: 50 y/o  M; chest pain.

EXAM:
CHEST  2 VIEW

[chest pa]
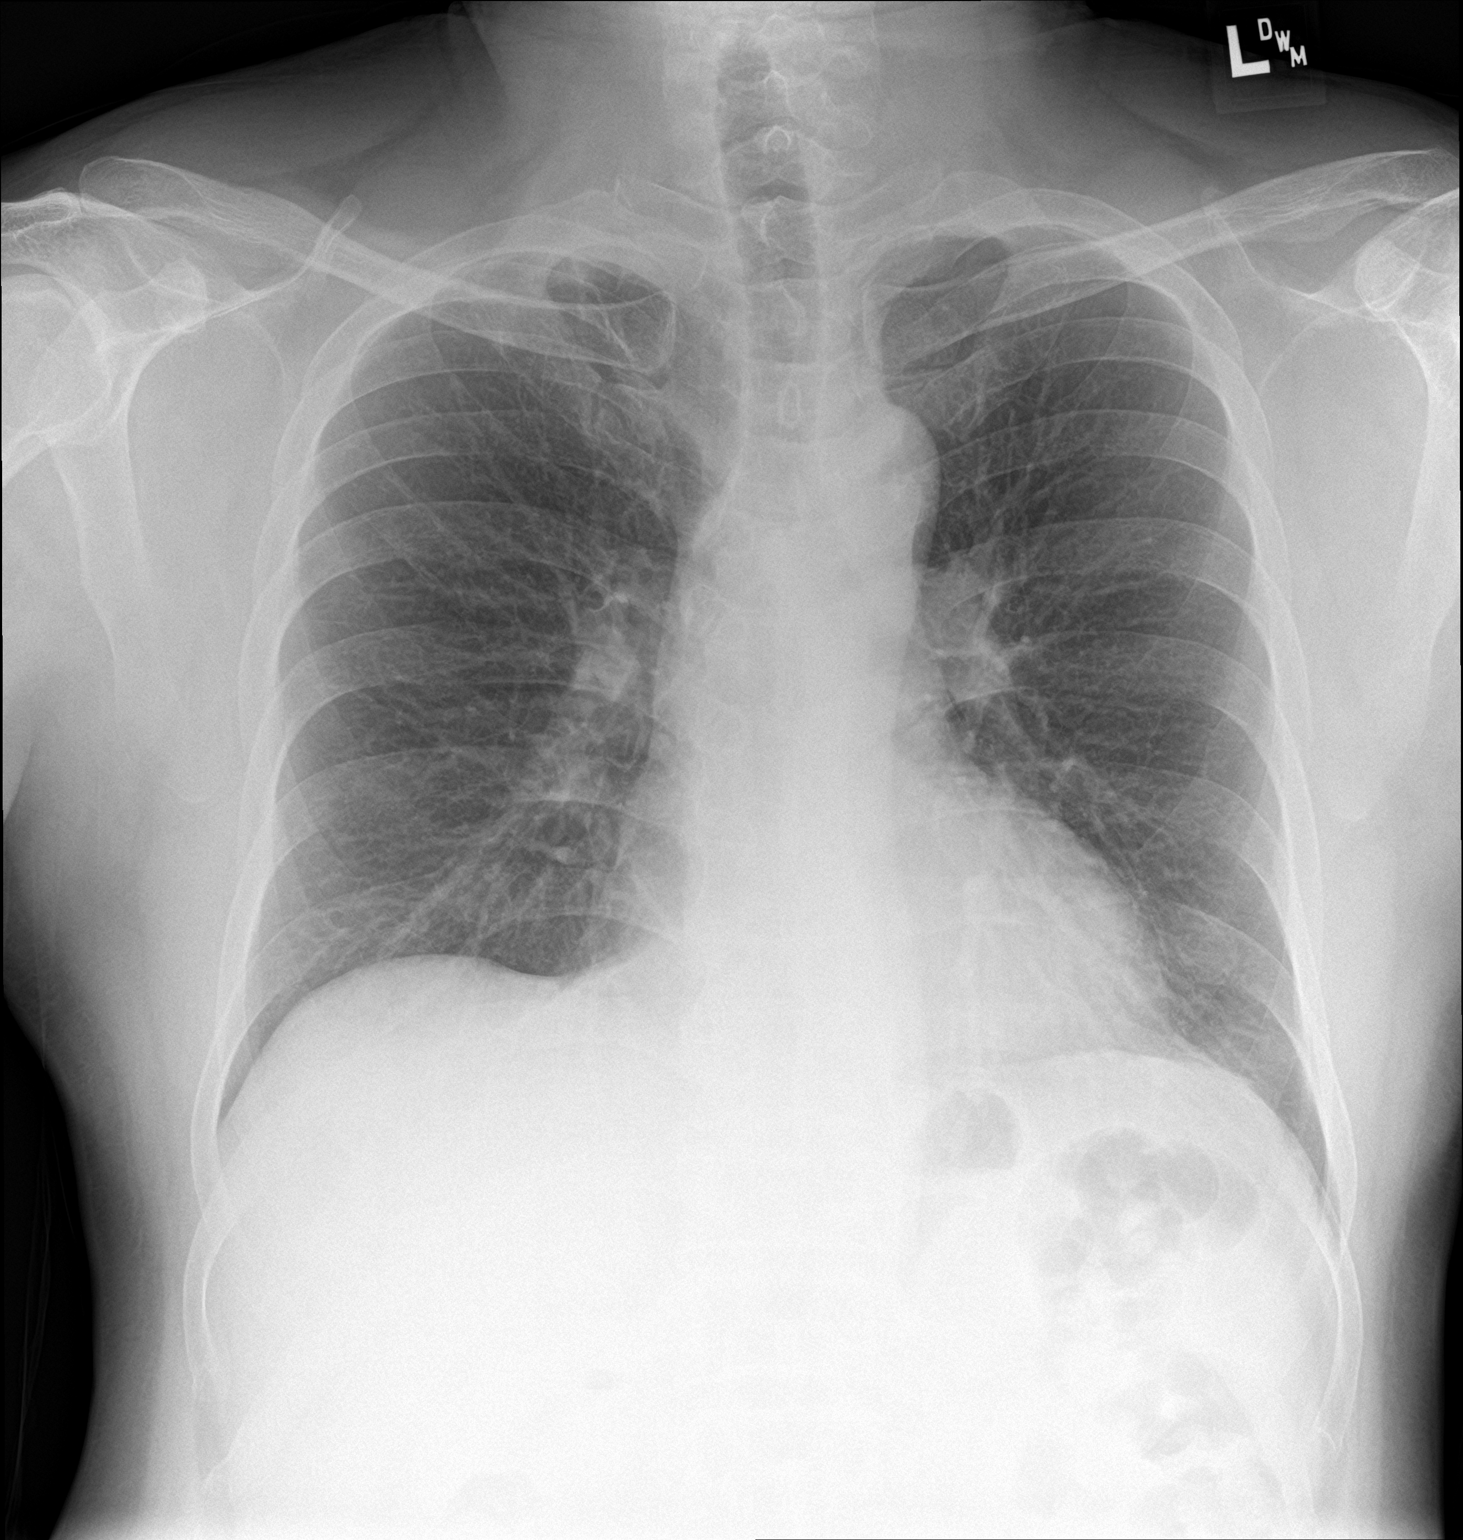

[chest lat]
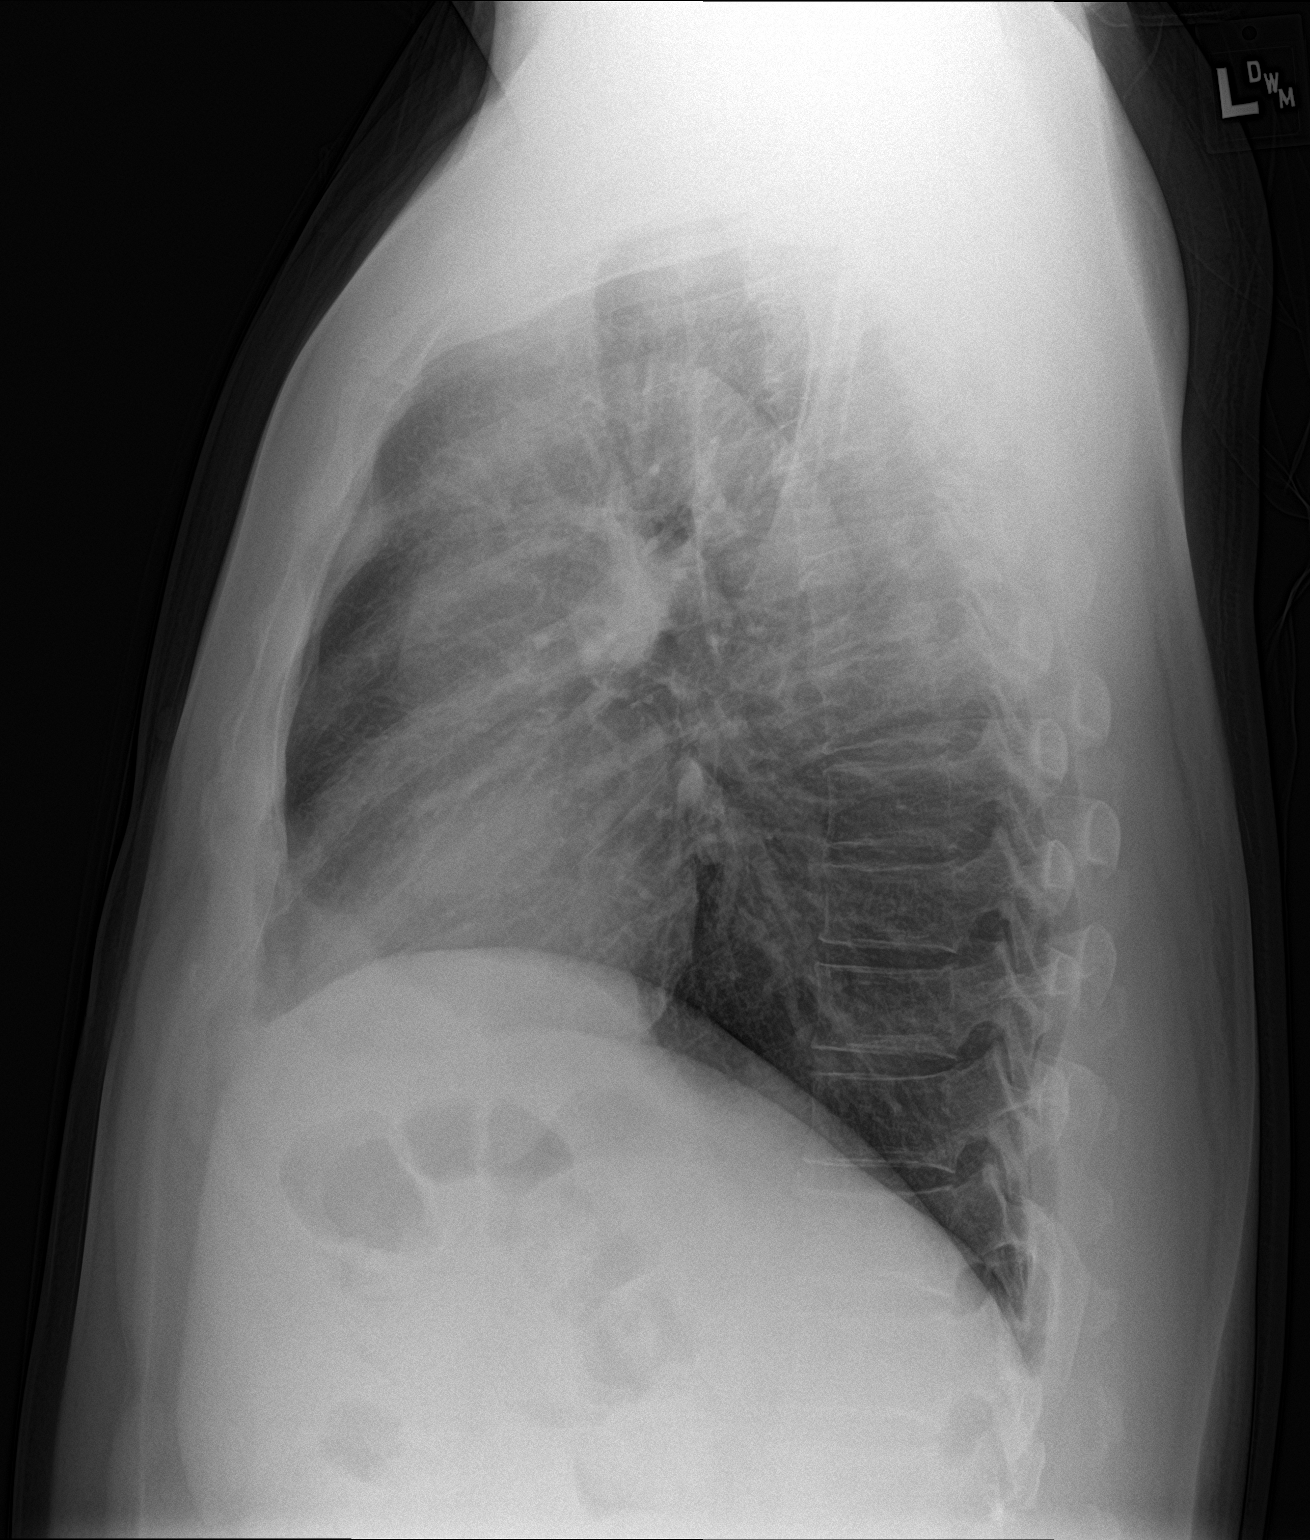

[2 of 2 positions shown; findings below may reference images not displayed]

FINDINGS: The heart size and mediastinal contours are within normal limits.
Both lungs are clear. The visualized skeletal structures are
unremarkable.
IMPRESSION: No active cardiopulmonary disease.

By: Estibenson Cotera M.D.
